# Patient Record
Sex: Female | Born: 1955 | Race: White | Hispanic: No
Health system: Midwestern US, Community
[De-identification: ages and names within clinical notes are randomized; demographics above are authoritative.]

---

## 2000-01-07 ENCOUNTER — Other Ambulatory Visit: Admission: RE | Admit: 2000-01-07 | Discharge: 2000-01-07 | Payer: Self-pay | Admitting: Family Medicine

## 2001-01-13 ENCOUNTER — Other Ambulatory Visit: Admission: RE | Admit: 2001-01-13 | Discharge: 2001-01-13 | Payer: Self-pay | Admitting: Obstetrics and Gynecology

## 2001-01-25 ENCOUNTER — Encounter: Payer: Self-pay | Admitting: Family Medicine

## 2001-01-25 ENCOUNTER — Encounter: Admission: RE | Admit: 2001-01-25 | Discharge: 2001-01-25 | Payer: Self-pay | Admitting: Family Medicine

## 2002-05-10 ENCOUNTER — Encounter: Payer: Self-pay | Admitting: Family Medicine

## 2002-05-10 ENCOUNTER — Encounter: Admission: RE | Admit: 2002-05-10 | Discharge: 2002-05-10 | Payer: Self-pay | Admitting: Family Medicine

## 2003-10-04 ENCOUNTER — Other Ambulatory Visit: Admission: RE | Admit: 2003-10-04 | Discharge: 2003-10-04 | Payer: Self-pay | Admitting: Family Medicine

## 2003-11-06 ENCOUNTER — Encounter: Admission: RE | Admit: 2003-11-06 | Discharge: 2003-11-06 | Payer: Self-pay | Admitting: Family Medicine

## 2004-04-22 ENCOUNTER — Encounter: Admission: RE | Admit: 2004-04-22 | Discharge: 2004-04-22 | Payer: Self-pay | Admitting: General Surgery

## 2004-04-24 ENCOUNTER — Encounter: Admission: RE | Admit: 2004-04-24 | Discharge: 2004-04-24 | Payer: Self-pay | Admitting: General Surgery

## 2004-05-06 ENCOUNTER — Encounter: Admission: RE | Admit: 2004-05-06 | Discharge: 2004-05-06 | Payer: Self-pay | Admitting: General Surgery

## 2005-10-16 ENCOUNTER — Encounter: Admission: RE | Admit: 2005-10-16 | Discharge: 2005-10-16 | Payer: Self-pay | Admitting: Family Medicine

## 2005-11-09 ENCOUNTER — Other Ambulatory Visit: Admission: RE | Admit: 2005-11-09 | Discharge: 2005-11-09 | Payer: Self-pay | Admitting: Obstetrics and Gynecology

## 2007-01-12 ENCOUNTER — Encounter: Admission: RE | Admit: 2007-01-12 | Discharge: 2007-01-12 | Payer: Self-pay | Admitting: Family Medicine

## 2007-07-14 ENCOUNTER — Emergency Department (HOSPITAL_COMMUNITY): Admission: EM | Admit: 2007-07-14 | Discharge: 2007-07-14 | Payer: Self-pay | Admitting: Emergency Medicine
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
## 2019-10-21 ENCOUNTER — Ambulatory Visit: Payer: Medicare PPO | Attending: Internal Medicine

## 2019-10-21 DIAGNOSIS — Z23 Encounter for immunization: Secondary | ICD-10-CM | POA: Insufficient documentation

## 2019-10-21 NOTE — Progress Notes (Signed)
   Covid-19 Vaccination Clinic  Name:  DIVINITY KYLER    MRN: [id number] DOB: [birth year]  10/21/2019  Ms. Portell was observed post Covid-19 immunization for 30 minutes based on pre-vaccination screening without incidence. She was provided with Vaccine Information Sheet and instruction to access the V-Safe system.   Ms. Amiri was instructed to call 911 with any severe reactions post vaccine: Marland Kitchen Difficulty breathing  . Swelling of your face and throat  . A fast heartbeat  . A bad rash all over your body  . Dizziness and weakness    Immunizations Administered    Name Date Dose VIS Date Route   Pfizer COVID-19 Vaccine 10/21/2019  8:24 AM 0.3 mL 08/18/2019 Intramuscular   Manufacturer: [employer], [employer]   Lot: [id number]   NDC: [address]-2

## 2019-11-12 ENCOUNTER — Ambulatory Visit: Payer: Medicare PPO | Attending: Internal Medicine

## 2019-11-12 DIAGNOSIS — Z23 Encounter for immunization: Secondary | ICD-10-CM | POA: Insufficient documentation

## 2019-11-12 NOTE — Progress Notes (Signed)
   Covid-19 Vaccination Clinic  Name:  Michelle White    MRN: [id number] DOB: Aug 08, 1956  11/12/2019  [name] was observed post Covid-19 immunization for 30 minutes based on pre-vaccination screening without incident. She was provided with Vaccine Information Sheet and instruction to access the V-Safe system.   Ms. Bridge was instructed to call 911 with any severe reactions post vaccine: Marland Kitchen Difficulty breathing  . Swelling of face and throat  . A fast heartbeat  . A bad rash all over body  . Dizziness and weakness   Immunizations Administered    Name Date Dose VIS Date Route   Pfizer COVID-19 Vaccine 11/12/2019  1:12 PM 0.3 mL 08/18/2019 Intramuscular   Manufacturer: [employer], [employer]   Lot: [id number]   NDC: [address]-2

## 2021-12-05 NOTE — Telephone Encounter (Signed)
P/t left vm to schedule npa..The patient left the office before the visit was finished. Vm for pt that she will need a referral to the office
